# Patient Record
Sex: Male | Born: 2000 | Race: White | Hispanic: No | Marital: Single | Smoking: Never smoker
Health system: Southern US, Community
[De-identification: ages and names within clinical notes are randomized; demographics above are authoritative.]

## PROBLEM LIST (undated history)

## (undated) HISTORY — PX: OTHER SURGICAL HISTORY: SHX169

---
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
## 2021-11-29 ENCOUNTER — Other Ambulatory Visit: Payer: Self-pay | Admitting: Gastroenterology

## 2021-11-29 DIAGNOSIS — R634 Abnormal weight loss: Secondary | ICD-10-CM

## 2021-12-20 ENCOUNTER — Ambulatory Visit
Admission: RE | Admit: 2021-12-20 | Discharge: 2021-12-20 | Disposition: A | Discharge status: Home / Self Care | Payer: 59 | Source: Ambulatory Visit | Attending: Gastroenterology | Admitting: Gastroenterology

## 2021-12-20 DIAGNOSIS — R634 Abnormal weight loss: Secondary | ICD-10-CM

## 2021-12-20 IMAGING — CT CT ABD-PELV W/ CM
2 of 4 series · 12 of 46 positions shown, 14 images · IV contrast (agent unspecified)
Comparison: None.

CLINICAL DATA: Lower abdominal pain. Loose stools. 15 lb weight
loss in past 4 months.

EXAM:
CT ABDOMEN AND PELVIS WITH CONTRAST
TECHNIQUE: Multidetector CT imaging of the abdomen and pelvis was performed
using the standard protocol following bolus administration of
intravenous contrast.

[Series 2: abd pelvis 5.00 br40 s3 axial · axial · 0.55mm/px · z∈[+1226,+1636]mm · 9 of 98 slices shown, 11 images]
[im 8/98  soft-tissue]
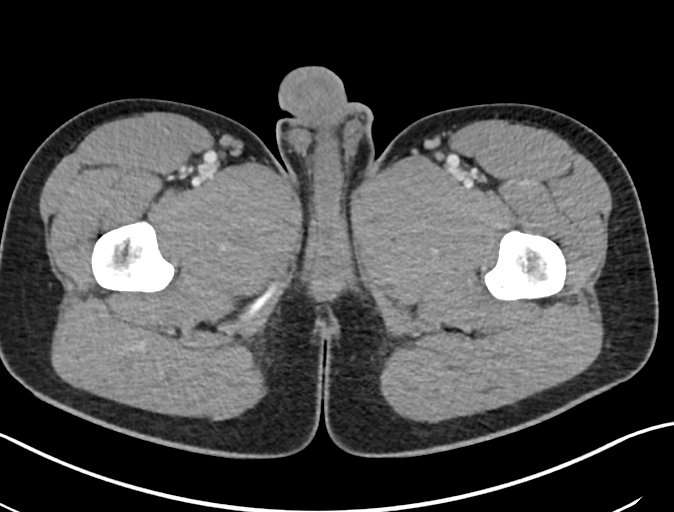
[im 8/98  bone]
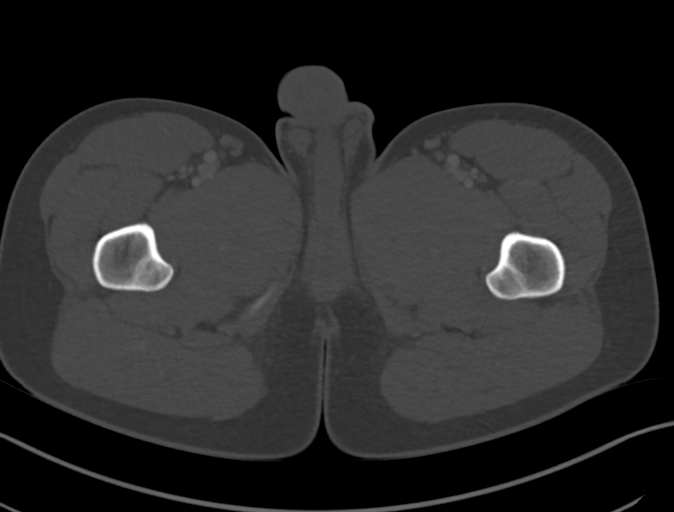
[im 20/98  soft-tissue]
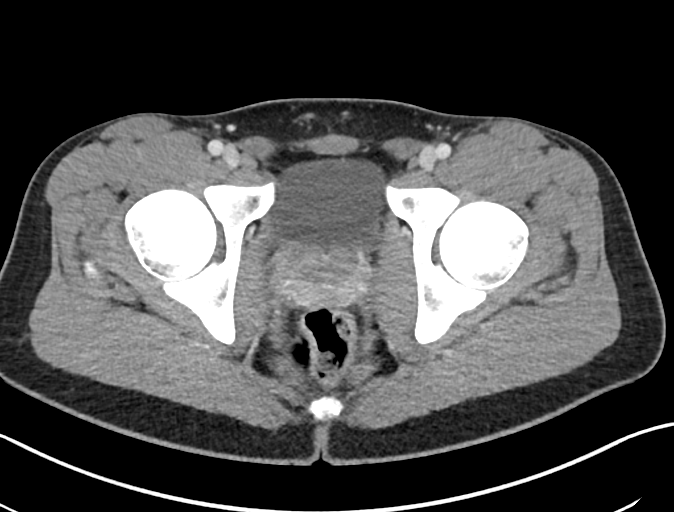
[im 28/98  soft-tissue]
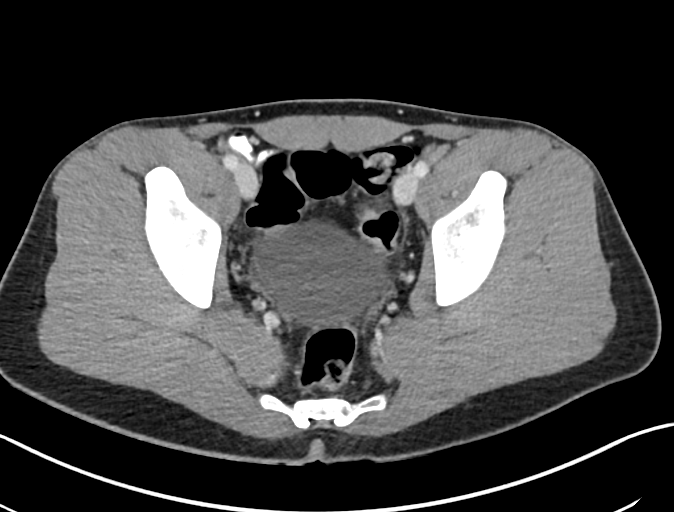
[im 39/98  soft-tissue]
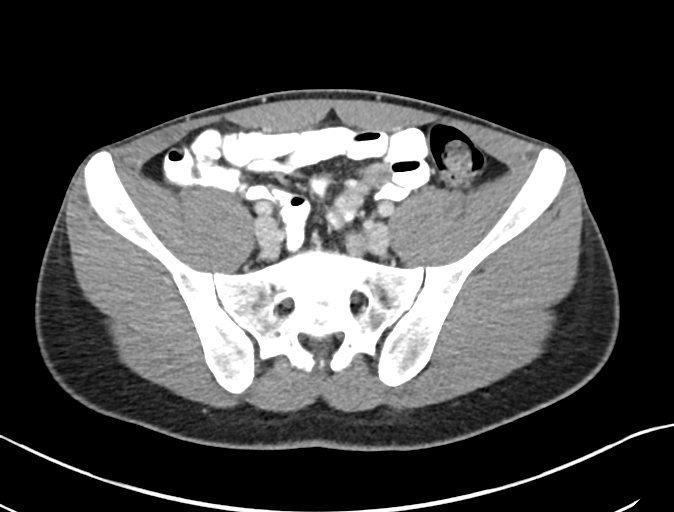
[im 51/98  soft-tissue]
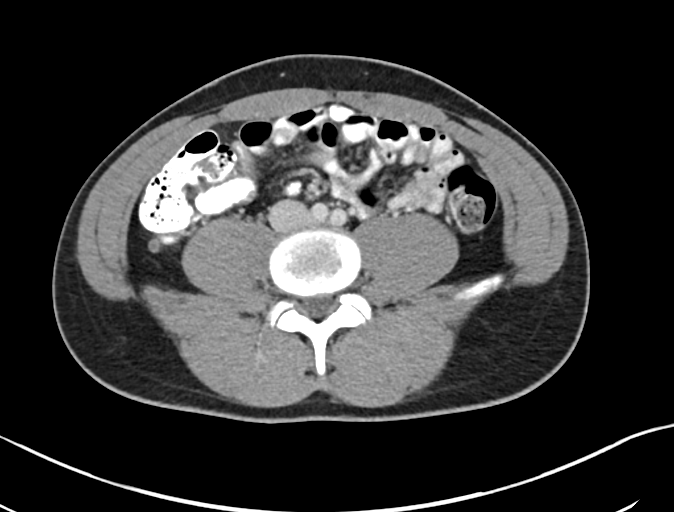
[im 59/98  soft-tissue]
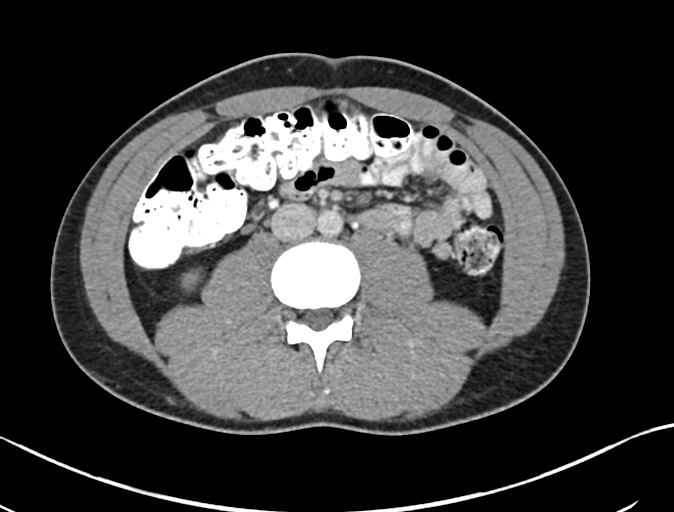
[im 70/98  soft-tissue]
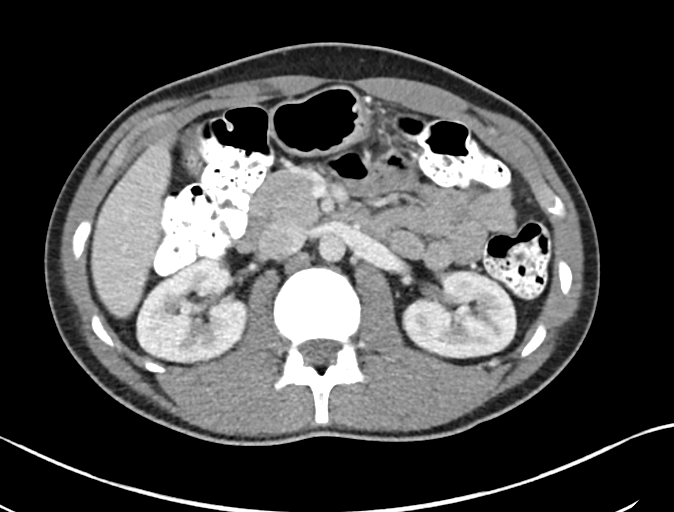
[im 82/98  soft-tissue]
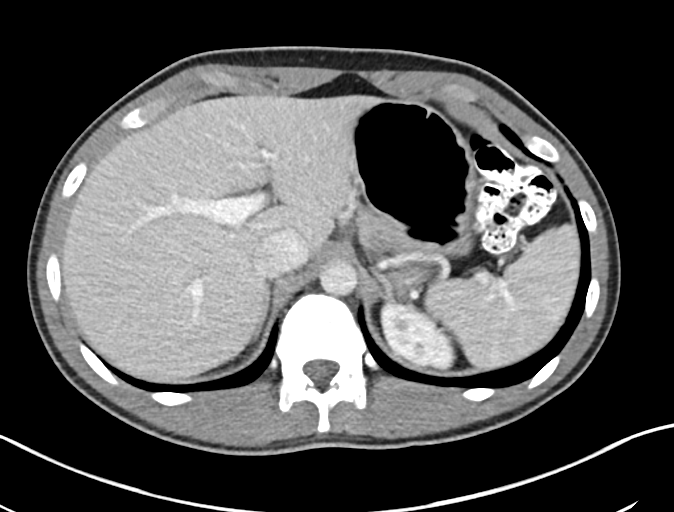
[im 90/98  soft-tissue]
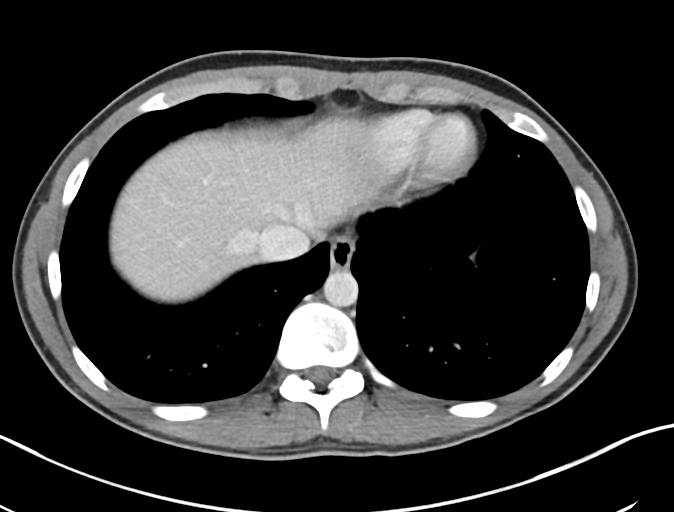
[im 90/98  bone]
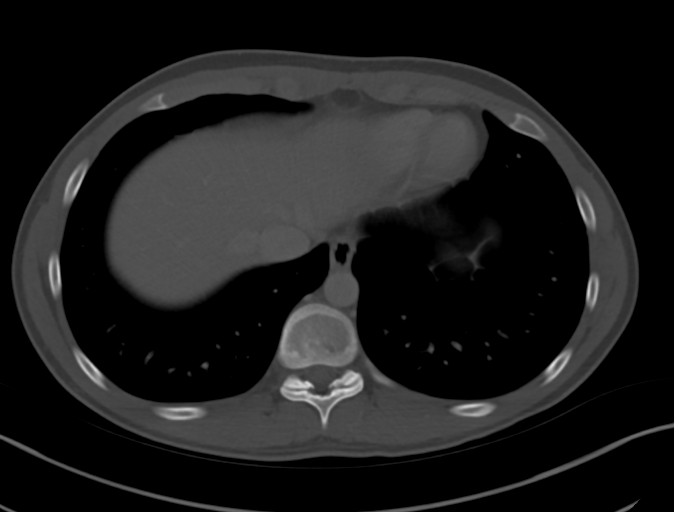

[Series 6: abd pelvis 2.00 br40 s3 cor · coronal · 0.72mm/px · 3 of 133 slices shown]
[im 45/133  soft-tissue]
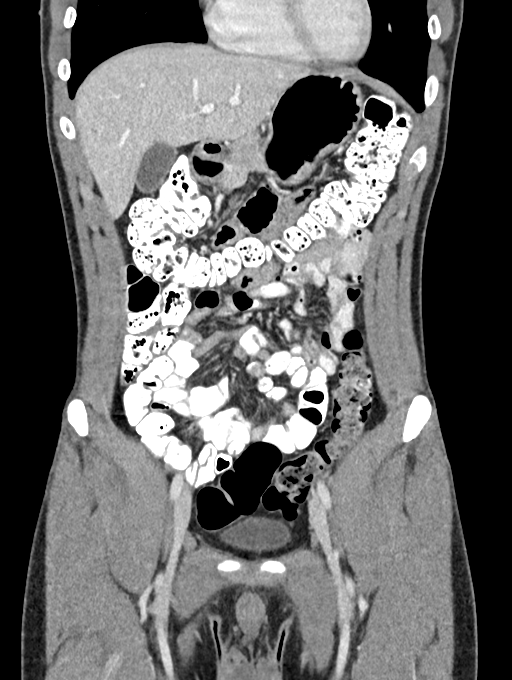
[im 59/133  soft-tissue]
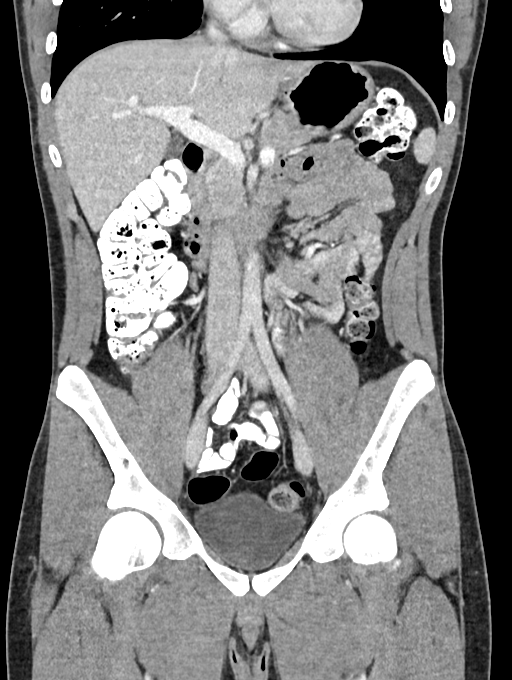
[im 74/133  soft-tissue]
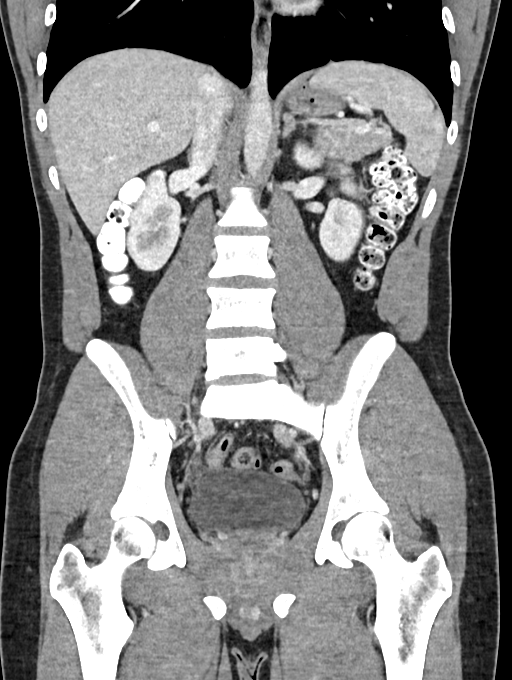

[12 of 46 positions shown; findings below may reference images not displayed]

RADIATION DOSE REDUCTION: This exam was performed according to the
departmental dose-optimization program which includes automated
exposure control, adjustment of the mA and/or kV according to
patient size and/or use of iterative reconstruction technique.

CONTRAST:  100mL [KN] IOPAMIDOL ([KN]) INJECTION 61%
FINDINGS: Lower Chest: No acute findings.

Hepatobiliary: No hepatic masses identified. Gallbladder is
unremarkable. No evidence of biliary ductal dilatation.

Pancreas:  No mass or inflammatory changes.

Spleen: Within normal limits in size and appearance.

Adrenals/Urinary Tract: No masses identified. No evidence of
ureteral calculi or hydronephrosis. Unremarkable unopacified urinary
bladder.

Stomach/Bowel: No evidence of obstruction, inflammatory process or
abnormal fluid collections. Normal appendix visualized.

Vascular/Lymphatic: No pathologically enlarged lymph nodes. No acute
vascular findings.

Reproductive:  No mass or other significant abnormality.

Other:  None.

Musculoskeletal:  No suspicious bone lesions identified.
IMPRESSION: Negative. No acute findings or other significant abnormality.

## 2021-12-20 MED ORDER — IOPAMIDOL (ISOVUE-300) INJECTION 61%
100.0000 mL | Freq: Once | INTRAVENOUS | Status: AC | PRN
  Administered 2021-12-20: 100 mL via INTRAVENOUS

## 2022-06-22 ENCOUNTER — Other Ambulatory Visit: Payer: Self-pay

## 2022-06-22 ENCOUNTER — Emergency Department (HOSPITAL_BASED_OUTPATIENT_CLINIC_OR_DEPARTMENT_OTHER)
Admission: EM | Admit: 2022-06-22 | Discharge: 2022-06-22 | Disposition: A | Discharge status: Home / Self Care | Payer: 59 | Attending: Emergency Medicine | Admitting: Emergency Medicine

## 2022-06-22 ENCOUNTER — Encounter (HOSPITAL_BASED_OUTPATIENT_CLINIC_OR_DEPARTMENT_OTHER): Payer: Self-pay

## 2022-06-22 DIAGNOSIS — L551 Sunburn of second degree: Secondary | ICD-10-CM | POA: Diagnosis present

## 2022-06-22 MED ORDER — IBUPROFEN 400 MG PO TABS
600.0000 mg | ORAL_TABLET | Freq: Once | ORAL | Status: AC
  Administered 2022-06-22: 600 mg via ORAL
  Filled 2022-06-22: qty 1

## 2022-06-22 MED ORDER — ACETAMINOPHEN 500 MG PO TABS
1000.0000 mg | ORAL_TABLET | Freq: Once | ORAL | Status: AC
  Administered 2022-06-22: 1000 mg via ORAL
  Filled 2022-06-22: qty 2
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
## 2023-04-13 ENCOUNTER — Emergency Department (HOSPITAL_BASED_OUTPATIENT_CLINIC_OR_DEPARTMENT_OTHER)
Admission: EM | Admit: 2023-04-13 | Discharge: 2023-04-13 | Disposition: A | Discharge status: Home / Self Care | Payer: 59 | Source: Home / Self Care | Attending: Emergency Medicine | Admitting: Emergency Medicine

## 2023-04-13 ENCOUNTER — Other Ambulatory Visit: Payer: Self-pay

## 2023-04-13 ENCOUNTER — Encounter (HOSPITAL_BASED_OUTPATIENT_CLINIC_OR_DEPARTMENT_OTHER): Payer: Self-pay | Admitting: Emergency Medicine

## 2023-04-13 DIAGNOSIS — M62838 Other muscle spasm: Secondary | ICD-10-CM

## 2023-04-13 DIAGNOSIS — E876 Hypokalemia: Secondary | ICD-10-CM | POA: Diagnosis not present

## 2023-04-13 LAB — CBC WITH DIFFERENTIAL/PLATELET
Abs Immature Granulocytes: 0.05 10*3/uL (ref 0.00–0.07)
Basophils Absolute: 0 10*3/uL (ref 0.0–0.1)
Basophils Relative: 0 %
Eosinophils Absolute: 0.1 10*3/uL (ref 0.0–0.5)
Eosinophils Relative: 0 %
HCT: 38.7 % — ABNORMAL LOW (ref 39.0–52.0)
Hemoglobin: 13.3 g/dL (ref 13.0–17.0)
Immature Granulocytes: 0 %
Lymphocytes Relative: 12 %
Lymphs Abs: 1.5 10*3/uL (ref 0.7–4.0)
MCH: 29.4 pg (ref 26.0–34.0)
MCHC: 34.4 g/dL (ref 30.0–36.0)
MCV: 85.4 fL (ref 80.0–100.0)
Monocytes Absolute: 1 10*3/uL (ref 0.1–1.0)
Monocytes Relative: 8 %
Neutro Abs: 10.3 10*3/uL — ABNORMAL HIGH (ref 1.7–7.7)
Neutrophils Relative %: 80 %
Platelets: 275 10*3/uL (ref 150–400)
RBC: 4.53 MIL/uL (ref 4.22–5.81)
RDW: 12.6 % (ref 11.5–15.5)
WBC: 13 10*3/uL — ABNORMAL HIGH (ref 4.0–10.5)
nRBC: 0 % (ref 0.0–0.2)

## 2023-04-13 LAB — URINALYSIS, ROUTINE W REFLEX MICROSCOPIC
Bilirubin Urine: NEGATIVE
Glucose, UA: NEGATIVE mg/dL
Hgb urine dipstick: NEGATIVE
Ketones, ur: NEGATIVE mg/dL
Leukocytes,Ua: NEGATIVE
Nitrite: NEGATIVE
Protein, ur: NEGATIVE mg/dL
Specific Gravity, Urine: 1.014 (ref 1.005–1.030)
pH: 6.5 (ref 5.0–8.0)

## 2023-04-13 LAB — BASIC METABOLIC PANEL
Anion gap: 10 (ref 5–15)
BUN: 10 mg/dL (ref 6–20)
CO2: 24 mmol/L (ref 22–32)
Calcium: 9.2 mg/dL (ref 8.9–10.3)
Chloride: 104 mmol/L (ref 98–111)
Creatinine, Ser: 0.89 mg/dL (ref 0.61–1.24)
GFR, Estimated: >60 mL/min (ref >60)
Glucose, Bld: 86 mg/dL (ref 70–99)
Potassium: 3.2 mmol/L — ABNORMAL LOW (ref 3.5–5.1)
Sodium: 138 mmol/L (ref 135–145)

## 2023-04-13 MED ORDER — POTASSIUM CHLORIDE CRYS ER 20 MEQ PO TBCR
40.0000 meq | EXTENDED_RELEASE_TABLET | Freq: Once | ORAL | Status: AC
  Administered 2023-04-13: 40 meq via ORAL
  Filled 2023-04-13: qty 2

## 2023-04-13 MED ORDER — POTASSIUM CHLORIDE CRYS ER 20 MEQ PO TBCR: 20.0000 meq | EXTENDED_RELEASE_TABLET | Freq: Two times a day (BID) | ORAL | 0 refills | Status: AC

## 2023-04-13 NOTE — ED Provider Notes (Signed)
[place] EMERGENCY DEPARTMENT AT [facility] Provider Note   CSN: [id number] Arrival date & time: 04/13/23  2016     History  Chief Complaint  Patient presents with   Spasms    Peter Robbins is a 22 y.o. male.  Patient here with dehydration symptoms.  Started to get some cramps in his hands and stomach and lightheaded while playing softball outside.  He is feeling a lot better now after drinking Gatorade and getting out of the heat.  He is not having any symptoms anymore.  No medical problems.  Denies any nausea vomiting diarrhea cramps currently.  No chest pain or shortness of breath.  Did not pass out or lose consciousness.  The history is provided by the patient.       Home Medications Prior to Admission medications   Medication Sig Start Date End Date Taking? Authorizing Provider  potassium chloride SA (KLOR-CON M) 20 MEQ tablet Take 1 tablet (20 mEq total) by mouth 2 (two) times daily for 3 days. 04/13/23 04/16/23 Yes Nejla Reasor, DO  amoxicillin (AMOXIL) 500 MG capsule Take 1 capsule (500 mg total) by mouth 3 (three) times daily. 08/19/15   Elvina Sidle, MD      Allergies    Patient has no known allergies.    Review of Systems   Review of Systems  Physical Exam Updated Vital Signs BP 121/73   Pulse 63   Temp 99.2 F (37.3 C)   Resp 18   SpO2 99%  Physical Exam Vitals and nursing note reviewed.  Constitutional:      General: He is not in acute distress.    Appearance: He is well-developed.  HENT:     Head: Normocephalic and atraumatic.     Nose: Nose normal.     Mouth/Throat:     Mouth: Mucous membranes are moist.  Eyes:     Extraocular Movements: Extraocular movements intact.     Conjunctiva/sclera: Conjunctivae normal.     Pupils: Pupils are equal, round, and reactive to light.  Cardiovascular:     Rate and Rhythm: Normal rate and regular rhythm.     Pulses: Normal pulses.     Heart sounds: Normal heart sounds. No murmur  heard. Pulmonary:     Effort: Pulmonary effort is normal. No respiratory distress.     Breath sounds: Normal breath sounds.  Abdominal:     Palpations: Abdomen is soft.     Tenderness: There is no abdominal tenderness.  Musculoskeletal:        General: No swelling.     Cervical back: Normal range of motion and neck supple.  Skin:    General: Skin is warm and dry.     Capillary Refill: Capillary refill takes less than 2 seconds.  Neurological:     General: No focal deficit present.     Mental Status: He is alert and oriented to person, place, and time.     Cranial Nerves: No cranial nerve deficit.     Sensory: No sensory deficit.     Motor: No weakness.  Psychiatric:        Mood and Affect: Mood normal.     ED Results / Procedures / Treatments   Labs (all labs ordered are listed, but only abnormal results are displayed) Labs Reviewed  CBC WITH DIFFERENTIAL/PLATELET - Abnormal; Notable for the following components:      Result Value   WBC 13.0 (*)    HCT 38.7 (*)    Neutro  Abs 10.3 (*)    All other components within normal limits  BASIC METABOLIC PANEL - Abnormal; Notable for the following components:   Potassium 3.2 (*)    All other components within normal limits  URINALYSIS, ROUTINE W REFLEX MICROSCOPIC    EKG None  Radiology No results found.  Procedures Procedures    Medications Ordered in ED Medications  potassium chloride SA (KLOR-CON M) CR tablet 40 mEq (40 mEq Oral Given 04/13/23 2226)    ED Course/ Medical Decision Making/ A&P                                 Medical Decision Making Amount and/or Complexity of Data Reviewed Labs: ordered.  Risk Prescription drug management.   Amie Critchley is here after developing cramps while playing softball.  Symptoms now resolved after drinking Gatorade and getting out of the heat.  Normal vitals.  No fever.  Very well-appearing.  He is already had a CBC BMP and urinalysis prior to my evaluation which  were unremarkable per my review and interpretation of labs.  Potassium was 3.2.  Given oral potassium.  I will give him some potassium supplements for home.  He is already asymptomatic.  I think he already did a great job hydrating and removing himself from the heat.  He has no evidence to suggest rhabdomyolysis as kidney function is normal.  Urinalysis negative for infection.  Overall I think heat related cramps today.  Recommend continue oral hydration at home.  Discharged in good condition.  Understands return precautions.  This chart was dictated using voice recognition software.  Despite best efforts to proofread,  errors can occur which can change the documentation meaning.         Final Clinical Impression(s) / ED Diagnoses Final diagnoses:  Muscle spasm  Hypokalemia    Rx / DC Orders ED Discharge Orders          Ordered    potassium chloride SA (KLOR-CON M) 20 MEQ tablet  2 times daily        04/13/23 2230              Virgina Norfolk, DO 04/13/23 2231

## 2023-04-13 NOTE — ED Triage Notes (Signed)
While playing softball, dehydration symptoms. Hand cramps, dizziness nausea. Symptoms at 7:15pm  Resolved now, after cooling down and gatorade.
